# Patient Record
Sex: Male | Born: 1970 | Race: White | Hispanic: No | Marital: Married | Smoking: Never smoker
Health system: Southern US, Community
[De-identification: ages and names within clinical notes are randomized; demographics above are authoritative.]

## PROBLEM LIST (undated history)

## (undated) DIAGNOSIS — K8020 Calculus of gallbladder without cholecystitis without obstruction: Secondary | ICD-10-CM

## (undated) HISTORY — PX: BACK SURGERY: SHX140

---

## 2007-03-06 ENCOUNTER — Ambulatory Visit (HOSPITAL_COMMUNITY): Admission: RE | Admit: 2007-03-06 | Discharge: 2007-03-07 | Payer: Self-pay | Admitting: Neurosurgery

## 2011-01-16 NOTE — Op Note (Signed)
NAME:  [name], Paul             ACCOUNT NO.:  [id number]   MEDICAL RECORD NO.:  [id number]          PATIENT TYPE:  OIB   LOCATION:  3172                         FACILITY:  [facility]   PHYSICIAN:  Henry A. Pool, M.D.    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   PROCEDURE NOTE:  Left L4-5 laminotomy and microdiskectomy.   SURGEON:  Pool.   ASSISTANT:  Kritzer.   ANESTHESIA:  General oroendotracheal.   PREMEDICATION:  [name] is a 40 year old male with history of severe  back and left lower extremity pain consistent with left-sided L5  radiculopathy.  Workup demonstrates evidence of very large left  paracentral disk herniation with severe compression upon the thecal sac  and left L5 nerve root.  The patient has been counseled as to his  options.  He decided to proceed with an urgent left-sided L4-5  laminotomy microdiskectomy.   OPERATIVE NOTE:  The patient taken to the operating room and placed on  the operating table in supine position.  After an adequate level of  anesthesia achieved, the patient was positioned prone onto Wilson frame,  appropriately padded.  The patient's lumbar region was prepped and  draped sterilely.  A 10 blade was used to make a linear incision  overlying the L4-5 interspace.  This was carried down sharply in the  midline.  A subperiosteal dissection was performed exposing the lamina  and facet joint of L4-L5 on the left side.  Deep self-retaining  retractor was placed.  Intraoperative x-rays taken and level was  confirmed.  Laminotomy was then performed using a high-speed drill and  Kerrison rongeurs to remove the inferior aspect of the lamina of L4,  medial aspect of L4-5 facet joint  and the superior rim of L5-1.  Ligament flavum was then elevated and resected in piecemeal fashion.  Underlying thecal sac and left L5 nerve root were identified.  Microscope was brought into the field __________  microdissection left  side of L5 nerve root underlying disk herniation.  Epidural venous  plexus was coagulated and cut.  Thecal sac and L5 nerve were gently  mobilized and directed towards the midline.  Disk herniation was readily  apparent.  This was incised with a 15 blade in rectangular fashion.  A  wide disk space clean-out was then achieved using pituitary rongeurs,  upbiting pituitary rongeurs and Epstein curettes.  All elements of the  disk herniation were completely resected.  All obviously degenerative  disk material was removed from the interspace.  After very thorough  diskectomy was performed, the canal was inspected, and there was no  evidence of any residual compression on the thecal sac or nerve roots.  There was no evidence of injury to the thecal sac or nerve roots.  Wound  was then irrigated with antibiotic solution.  Gelfoam was placed  topically for hemostasis which was found  be good.  Microscope and  retractor system were removed.  Hemostasis of the muscles was achieved  with electrocautery.  Wounds were closed in layers using Vicryl sutures.  Steri-Strips and  sterile dressing were applied.  There were no complications.  The  patient tolerated the procedure well, and he returned to the recovery  room for postoperative care.           ______________________________  Kathaleen Maser. Pool, M.D.     HAP/MEDQ  D:  03/06/2007  T:  03/07/2007  Job:  [id number]

## 2011-06-19 LAB — ABO/RH: ABO/RH(D): A POS

## 2011-06-19 LAB — CBC
Hemoglobin: 16.2
RBC: 5.43
WBC: 9.9

## 2011-06-19 LAB — DIFFERENTIAL
Lymphocytes Relative: 34
Lymphs Abs: 3.4 — ABNORMAL HIGH
Monocytes Absolute: 1 — ABNORMAL HIGH
Monocytes Relative: 10
Neutro Abs: 5.2
Neutrophils Relative %: 52

## 2011-06-19 LAB — TYPE AND SCREEN: Antibody Screen: NEGATIVE

## 2015-09-04 DIAGNOSIS — K8020 Calculus of gallbladder without cholecystitis without obstruction: Secondary | ICD-10-CM

## 2015-09-04 HISTORY — DX: Calculus of gallbladder without cholecystitis without obstruction: K80.20

## 2017-01-22 ENCOUNTER — Emergency Department (HOSPITAL_COMMUNITY): Payer: 59

## 2017-01-22 ENCOUNTER — Emergency Department (HOSPITAL_COMMUNITY)
Admission: EM | Admit: 2017-01-22 | Discharge: 2017-01-22 | Disposition: A | Discharge status: Home / Self Care | Payer: 59 | Attending: Emergency Medicine | Admitting: Emergency Medicine

## 2017-01-22 ENCOUNTER — Encounter (HOSPITAL_COMMUNITY): Payer: Self-pay | Admitting: *Deleted

## 2017-01-22 DIAGNOSIS — K8050 Calculus of bile duct without cholangitis or cholecystitis without obstruction: Secondary | ICD-10-CM | POA: Diagnosis not present

## 2017-01-22 DIAGNOSIS — Z79899 Other long term (current) drug therapy: Secondary | ICD-10-CM | POA: Diagnosis not present

## 2017-01-22 DIAGNOSIS — R1013 Epigastric pain: Secondary | ICD-10-CM | POA: Diagnosis present

## 2017-01-22 DIAGNOSIS — R1010 Upper abdominal pain, unspecified: Secondary | ICD-10-CM

## 2017-01-22 LAB — COMPREHENSIVE METABOLIC PANEL
ALK PHOS: 63 U/L (ref 38–126)
ALT: 63 U/L (ref 17–63)
AST: 39 U/L (ref 15–41)
Albumin: 4.2 g/dL (ref 3.5–5.0)
Anion gap: 8 (ref 5–15)
BUN: 18 mg/dL (ref 6–20)
CALCIUM: 9.1 mg/dL (ref 8.9–10.3)
CO2: 27 mmol/L (ref 22–32)
CREATININE: 1.13 mg/dL (ref 0.61–1.24)
Chloride: 103 mmol/L (ref 101–111)
Glucose, Bld: 120 mg/dL — ABNORMAL HIGH (ref 65–99)
Potassium: 3.7 mmol/L (ref 3.5–5.1)
Sodium: 138 mmol/L (ref 135–145)
Total Bilirubin: 0.3 mg/dL (ref 0.3–1.2)
Total Protein: 7.7 g/dL (ref 6.5–8.1)

## 2017-01-22 LAB — CBC WITH DIFFERENTIAL/PLATELET
Basophils Absolute: 0 10*3/uL (ref 0.0–0.1)
Basophils Relative: 0 %
Eosinophils Absolute: 0.2 10*3/uL (ref 0.0–0.7)
Eosinophils Relative: 2 %
HCT: 47.1 % (ref 39.0–52.0)
HEMOGLOBIN: 16.4 g/dL (ref 13.0–17.0)
LYMPHS ABS: 3.5 10*3/uL (ref 0.7–4.0)
LYMPHS PCT: 39 %
MCH: 30.8 pg (ref 26.0–34.0)
MCHC: 34.8 g/dL (ref 30.0–36.0)
MCV: 88.5 fL (ref 78.0–100.0)
Monocytes Absolute: 0.8 10*3/uL (ref 0.1–1.0)
Monocytes Relative: 8 %
NEUTROS ABS: 4.6 10*3/uL (ref 1.7–7.7)
NEUTROS PCT: 51 %
Platelets: 195 10*3/uL (ref 150–400)
RBC: 5.32 MIL/uL (ref 4.22–5.81)
RDW: 12.8 % (ref 11.5–15.5)
WBC: 9.1 10*3/uL (ref 4.0–10.5)

## 2017-01-22 LAB — LIPASE, BLOOD: LIPASE: 35 U/L (ref 11–51)

## 2017-01-22 MED ORDER — HYDROCODONE-ACETAMINOPHEN 5-325 MG PO TABS: ORAL_TABLET | ORAL | 0 refills | Status: DC

## 2017-01-22 MED ORDER — IOPAMIDOL (ISOVUE-300) INJECTION 61%
100.0000 mL | Freq: Once | INTRAVENOUS | Status: AC | PRN
  Administered 2017-01-22: 100 mL via INTRAVENOUS

## 2017-01-22 MED ORDER — HYDROMORPHONE HCL 1 MG/ML IJ SOLN
1.0000 mg | Freq: Once | INTRAMUSCULAR | Status: AC
  Administered 2017-01-22: 1 mg via INTRAVENOUS
  Filled 2017-01-22: qty 1

## 2017-01-22 MED ORDER — IOPAMIDOL (ISOVUE-300) INJECTION 61%
INTRAVENOUS | Status: AC
Start: 2017-01-22 — End: 2017-01-22
  Administered 2017-01-22: 30 mL
  Filled 2017-01-22: qty 30

## 2017-01-22 MED ORDER — ONDANSETRON 4 MG PO TBDP: 4.0000 mg | ORAL_TABLET | Freq: Three times a day (TID) | ORAL | 0 refills | Status: AC | PRN

## 2017-01-22 MED ORDER — ONDANSETRON HCL 4 MG/2ML IJ SOLN
4.0000 mg | Freq: Once | INTRAMUSCULAR | Status: AC
  Administered 2017-01-22: 4 mg via INTRAVENOUS
  Filled 2017-01-22: qty 2

## 2017-01-22 NOTE — ED Triage Notes (Signed)
Pt c/o epigastric abdominal pain that started last night around 2230; pt states he has vomited x 1

## 2017-01-22 NOTE — ED Notes (Signed)
PT tolerating po fluids with no nausea at this time.

## 2017-01-22 NOTE — ED Provider Notes (Signed)
[facility] DEPT Provider Note   CSN: [id number] Arrival date & time: 01/22/17  0251     History   Chief Complaint Chief Complaint  Patient presents with  . Abdominal Pain    HPI Paul [name] is a 46 y.o. male.  HPI Patient presents to the emergency department with complaints of acute onset epigastric upper abdominal pain which began last night at approximately 11 PM.  He's never had pain like this before.  He vomited once at home and is now vomiting here in the ER.  He's never had such severe pain in his upper abdomen.  Denies lower abdominal pain.  Denies flank pain.  Reports no dysuria or urinary frequency.  His pain is severe in severity.  Denies chest pain shortness of breath.  No recent cough or illness.  Denies fevers and chills   History reviewed. No pertinent past medical history.  There are no active problems to display for this patient.   Past Surgical History:  Procedure Laterality Date  . BACK SURGERY         Home Medications    Prior to Admission medications   Not on File    Family History History reviewed. No pertinent family history.  Social History Social History  Substance Use Topics  . Smoking status: Never Smoker  . Smokeless tobacco: Never Used  . Alcohol use Yes     Comment: rarely     Allergies   Patient has no known allergies.   Review of Systems Review of Systems  All other systems reviewed and are negative.    Physical Exam Updated Vital Signs BP (!) 152/96 (BP Location: Left Arm)   Pulse (!) 57   Temp 97.6 F (36.4 C) (Oral)   Resp (!) 21   Ht 5\' 8"  (1.727 m)   Wt 122.5 kg (270 lb)   SpO2 98%   BMI 41.05 kg/m   Physical Exam  Constitutional: He is oriented to person, place, and time. He appears well-developed and well-nourished.  Uncomfortable appearing  HENT:  Head: Normocephalic and atraumatic.  Eyes: EOM are normal.  Neck: Normal range of motion.  Cardiovascular: Normal rate, regular rhythm and  normal heart sounds.   Pulmonary/Chest: Effort normal and breath sounds normal. No respiratory distress.  Abdominal: Soft. He exhibits no distension.  Mild epigastric tenderness  Musculoskeletal: Normal range of motion.  Neurological: He is alert and oriented to person, place, and time.  Skin: Skin is warm and dry.  Psychiatric: He has a normal mood and affect. Judgment normal.  Nursing note and vitals reviewed.    ED Treatments / Results  Labs (all labs ordered are listed, but only abnormal results are displayed) Labs Reviewed  COMPREHENSIVE METABOLIC PANEL - Abnormal; Notable for the following:       Result Value   Glucose, Bld 120 (*)    All other components within normal limits  CBC WITH DIFFERENTIAL/PLATELET  LIPASE, BLOOD    EKG  EKG Interpretation None       Radiology Ct Abdomen Pelvis W Contrast  Result Date: 01/22/2017 CLINICAL DATA:  Acute onset severe upper abdominal and epigastric pain. Vomiting. EXAM: CT ABDOMEN AND PELVIS WITH CONTRAST TECHNIQUE: Multidetector CT imaging of the abdomen and pelvis was performed using the standard protocol following bolus administration of intravenous contrast. CONTRAST:  30mL ISOVUE-300 IOPAMIDOL (ISOVUE-300) INJECTION 61%, ISOVUE-300 IOPAMIDOL (ISOVUE-300) INJECTION 61% COMPARISON:  None. FINDINGS: Lower chest: Atelectasis in the lung bases. Hepatobiliary: Diffuse fatty infiltration of the  liver. No focal lesions. Gallbladder and bile ducts are unremarkable. Pancreas: Unremarkable. No pancreatic ductal dilatation or surrounding inflammatory changes. Spleen: Normal in size without focal abnormality. Adrenals/Urinary Tract: Adrenal glands are unremarkable. Kidneys are normal, without renal calculi, focal lesion, or hydronephrosis. Bladder is unremarkable. Stomach/Bowel: Stomach, small bowel, and colon are not abnormally distended. No wall thickening is appreciated. Scattered stool throughout the colon. Appendix is normal.  Vascular/Lymphatic: No significant vascular findings are present. No enlarged abdominal or pelvic lymph nodes. Reproductive: Prostate gland is enlarged, measuring 4 x 4.3 cm diameter. Other: No abdominal wall hernia or abnormality. No abdominopelvic ascites. Musculoskeletal: Degenerative changes in the spine. No destructive bone lesions. IMPRESSION: Diffuse fatty infiltration of the liver. No evidence of bowel obstruction or inflammation. Prostate enlargement. Electronically Signed   By: Burman NievesWilliam  Stevens M.D.   On: 01/22/2017 04:38    Procedures Procedures (including critical care time)  Medications Ordered in ED Medications  HYDROmorphone (DILAUDID) injection 1 mg (1 mg Intravenous Given 01/22/17 0340)  ondansetron (ZOFRAN) injection 4 mg (4 mg Intravenous Given 01/22/17 0338)  iopamidol (ISOVUE-300) 61 % injection (30 mLs  Contrast Given 01/22/17 0413)  iopamidol (ISOVUE-300) 61 % injection 100 mL (100 mLs Intravenous Contrast Given 01/22/17 0413)  HYDROmorphone (DILAUDID) injection 1 mg (1 mg Intravenous Given 01/22/17 0433)     Initial Impression / Assessment and Plan / ED Course  I have reviewed the triage vital signs and the nursing notes.  Pertinent labs & imaging results that were available during my care of the patient were reviewed by me and considered in my medical decision making (see chart for details).     6:21 AM Patient still with some discomfort.  Patient will undergo ultrasound to further evaluate.  Suspect this is likely biliary colic.  It would be helpful to confirm gallstones on ultrasound.  If his ultrasound shows no signs of gallstones he may benefit from outpatient GI follow-up and endoscopy.  Symptomatic control now  7:10 AM Pain nearly gone now. US pending. Care transferred to Dr Clarene DukeMcManus to follow up on US  Final Clinical Impressions(s) / ED Diagnoses   Final diagnoses:  Upper abdominal pain    New Prescriptions New Prescriptions   No medications on file      Azalia Bilisampos, Latravia Southgate, MD 01/22/17 [phone]

## 2017-01-22 NOTE — Discharge Instructions (Signed)
Eat a bland diet, avoiding greasy, fatty, fried foods, as well as spicy and acidic foods or beverages.  Avoid eating within the hour or 2 before going to bed or laying down.  Also avoid teas, colas, coffee, chocolate, pepermint and spearment.  Take the prescriptions as directed.  Call the General Surgeon today to schedule a follow up appointment within the next week.  Return to the Emergency Department immediately if worsening.

## 2017-01-22 NOTE — ED Provider Notes (Signed)
Pt received at sign out with Paul [name] RUQ pending. Paul [name] without acute cholecystitis. LFT's and WBC count normal. Pt has tol PO well without N/V. States he wants to go home now. Dx and testing d/w pt and family.  Questions answered.  Verb understanding, agreeable to d/c home with outpt f/u.    Results for orders placed or performed during the hospital encounter of 01/22/17  CBC with Differential/Platelet  Result Value Ref Range   WBC 9.1 4.0 - 10.5 K/uL   RBC 5.32 4.22 - 5.81 MIL/uL   Hemoglobin 16.4 13.0 - 17.0 g/dL   HCT 16.1 09.6 - 04.5 %   MCV 88.5 78.0 - 100.0 fL   MCH 30.8 26.0 - 34.0 pg   MCHC 34.8 30.0 - 36.0 g/dL   RDW 40.9 81.1 - 91.4 %   Platelets 195 150 - 400 K/uL   Neutrophils Relative % 51 %   Neutro Abs 4.6 1.7 - 7.7 K/uL   Lymphocytes Relative 39 %   Lymphs Abs 3.5 0.7 - 4.0 K/uL   Monocytes Relative 8 %   Monocytes Absolute 0.8 0.1 - 1.0 K/uL   Eosinophils Relative 2 %   Eosinophils Absolute 0.2 0.0 - 0.7 K/uL   Basophils Relative 0 %   Basophils Absolute 0.0 0.0 - 0.1 K/uL  Comprehensive metabolic panel  Result Value Ref Range   Sodium 138 135 - 145 mmol/L   Potassium 3.7 3.5 - 5.1 mmol/L   Chloride 103 101 - 111 mmol/L   CO2 27 22 - 32 mmol/L   Glucose, Bld 120 (H) 65 - 99 mg/dL   BUN 18 6 - 20 mg/dL   Creatinine, Ser 7.82 0.61 - 1.24 mg/dL   Calcium 9.1 8.9 - 95.6 mg/dL   Total Protein 7.7 6.5 - 8.1 g/dL   Albumin 4.2 3.5 - 5.0 g/dL   AST 39 15 - 41 U/L   ALT 63 17 - 63 U/L   Alkaline Phosphatase 63 38 - 126 U/L   Total Bilirubin 0.3 0.3 - 1.2 mg/dL   GFR calc non Af Amer >60 >60 mL/min   GFR calc Af Amer >60 >60 mL/min   Anion gap 8 5 - 15  Lipase, blood  Result Value Ref Range   Lipase 35 11 - 51 U/L   Ct Abdomen Pelvis W Contrast Result Date: 01/22/2017 CLINICAL DATA:  Acute onset severe upper abdominal and epigastric pain. Vomiting. EXAM: CT ABDOMEN AND PELVIS WITH CONTRAST TECHNIQUE: Multidetector CT imaging of the abdomen and pelvis was performed  using the standard protocol following bolus administration of intravenous contrast. CONTRAST:  30mL ISOVUE-300 IOPAMIDOL (ISOVUE-300) INJECTION 61%, ISOVUE-300 IOPAMIDOL (ISOVUE-300) INJECTION 61% COMPARISON:  None. FINDINGS: Lower chest: Atelectasis in the lung bases. Hepatobiliary: Diffuse fatty infiltration of the liver. No focal lesions. Gallbladder and bile ducts are unremarkable. Pancreas: Unremarkable. No pancreatic ductal dilatation or surrounding inflammatory changes. Spleen: Normal in size without focal abnormality. Adrenals/Urinary Tract: Adrenal glands are unremarkable. Kidneys are normal, without renal calculi, focal lesion, or hydronephrosis. Bladder is unremarkable. Stomach/Bowel: Stomach, small bowel, and colon are not abnormally distended. No wall thickening is appreciated. Scattered stool throughout the colon. Appendix is normal. Vascular/Lymphatic: No significant vascular findings are present. No enlarged abdominal or pelvic lymph nodes. Reproductive: Prostate gland is enlarged, measuring 4 x 4.3 cm diameter. Other: No abdominal wall hernia or abnormality. No abdominopelvic ascites. Musculoskeletal: Degenerative changes in the spine. No destructive bone lesions. IMPRESSION: Diffuse fatty infiltration of the liver. No evidence of  bowel obstruction or inflammation. Prostate enlargement. Electronically Signed   By: Burman NievesWilliam  Stevens M.D.   On: 01/22/2017 04:38   [place] Abdomen Limited Ruq Result Date: 01/22/2017 CLINICAL DATA:  Right upper quadrant pain for 12 hours. EXAM: US ABDOMEN LIMITED - RIGHT UPPER QUADRANT COMPARISON:  CT abdomen and pelvis this same day. FINDINGS: Gallbladder: There is some sludge and a few small stones within the gallbladder measuring up to 0.9 cm. No gallbladder wall thickening or pericholecystic fluid. Common bile duct: Diameter: 0.4 cm. Liver: Increased echogenicity and coarsened echotexture are identified. No focal lesion. IMPRESSION: Small gallstones and  gallbladder sludge without evidence of cholecystitis. Fatty infiltration of the liver. Electronically Signed   By: Drusilla Kannerhomas  Dalessio M.D.   On: 01/22/2017 08:29      Paul [name], Paul Vandervoort, DO 01/22/17 1004

## 2017-01-24 ENCOUNTER — Encounter: Payer: Self-pay | Admitting: General Surgery

## 2017-01-24 ENCOUNTER — Ambulatory Visit (INDEPENDENT_AMBULATORY_CARE_PROVIDER_SITE_OTHER): Payer: 59 | Admitting: General Surgery

## 2017-01-24 VITALS — BP 141/91 | HR 70 | Temp 97.8°F | Resp 18 | Ht 68.0 in | Wt 269.0 lb

## 2017-01-24 DIAGNOSIS — K8020 Calculus of gallbladder without cholecystitis without obstruction: Secondary | ICD-10-CM

## 2017-01-24 NOTE — Progress Notes (Signed)
Paul [name]; [id number]; 1971/04/23   HPI  patient is a 46 year old white male who was referred by the emergency room for evaluation and treatment of cholelithiasis.  He had presented to the emergency room with an episode of right upper quadrant abdominal pain and nausea.  His liver enzyme tests were within normal limits.  Ultrasound gallbladder revealed cholelithiasis with normal common bile duct.  He had normal white blood cell count.  The pain subsequently resolved after 3-4 hours.  He has not had any further pain.  He states he has never had right upper quadrant abdominal pain with radiation to the right flank, nausea, fatty food intolerance, jaundice in the past.  He has been doing well since his visit to the emergency room.  He has 0/10 pain at the present time. No past medical history on file.  Past Surgical History:  Procedure Laterality Date  . BACK SURGERY      No family history on file.  Current Outpatient Prescriptions on File Prior to Visit  Medication Sig Dispense Refill  . fluticasone (FLONASE) 50 MCG/ACT nasal spray Place 1 spray into both nostrils daily.    Marland Kitchen. HYDROcodone-acetaminophen (NORCO/VICODIN) 5-325 MG tablet 1 or 2 tabs PO q6 hours prn pain 15 tablet 0  . levocetirizine (XYZAL) 5 MG tablet Take 5 mg by mouth every evening.    . naproxen sodium (ANAPROX) 220 MG tablet Take 220 mg by mouth 2 (two) times daily with a meal. For back pain    . ondansetron (ZOFRAN ODT) 4 MG disintegrating tablet Take 1 tablet (4 mg total) by mouth every 8 (eight) hours as needed for nausea or vomiting. 6 tablet 0   No current facility-administered medications on file prior to visit.     No Known Allergies  History  Alcohol Use  . Yes    Comment: rarely    History  Smoking Status  . Never Smoker  Smokeless Tobacco  . Never Used    Review of Systems  Constitutional: Positive for malaise/fatigue.  HENT: Negative.   Eyes: Negative.   Respiratory: Negative.    Cardiovascular: Negative.   Gastrointestinal: Negative.   Genitourinary: Negative.   Musculoskeletal: Negative.   Skin: Negative.   Neurological: Negative.   Endo/Heme/Allergies: Negative.   Psychiatric/Behavioral: Negative.     Objective   Vitals:   01/24/17 0933  BP: (!) 141/91  Pulse: 70  Resp: 18  Temp: 97.8 F (36.6 C)    Physical Exam  Constitutional: He is oriented to person, place, and time and well-developed, well-nourished, and in no distress.  HENT:  Head: Normocephalic and atraumatic.  Right Ear: External ear normal.  Eyes: No scleral icterus.  Neck: Normal range of motion. Neck supple.  Cardiovascular: Normal rate, regular rhythm and normal heart sounds.   No murmur heard. Pulmonary/Chest: Effort normal and breath sounds normal. He has no wheezes. He has no rales.  Abdominal: Soft. Bowel sounds are normal. He exhibits no distension. There is no tenderness. There is no rebound.  Neurological: He is alert and oriented to person, place, and time.  Skin: Skin is warm and dry.  Vitals reviewed.  ER notes reviewed Assessment  Cholelithiasis, currently asymptomatic Plan     Patient does not need a laparoscopic cholecystectomy at this time.  He was told that he has a good chance over the next 2-3 years of having a recurrent episode.  Patient would like to have the surgery later in the summer, which is fine with me.  We will given about biliary colic and gallstones.  He will call to schedule surgery.  Risks and benefits of the procedure including bleeding, infection, renal injury, and the possibility of an open procedure were fully explained to the patient, who gave informed consent.

## 2017-01-24 NOTE — Patient Instructions (Signed)
Laparoscopic Cholecystectomy Laparoscopic cholecystectomy is surgery to remove the gallbladder. The gallbladder is a pear-shaped organ that lies beneath the liver on the right side of the body. The gallbladder stores bile, which is a fluid that helps the body to digest fats. Cholecystectomy is often done for inflammation of the gallbladder (cholecystitis). This condition is usually caused by a buildup of gallstones (cholelithiasis) in the gallbladder. Gallstones can block the flow of bile, which can result in inflammation and pain. In severe cases, emergency surgery may be required. This procedure is done though small incisions in your abdomen (laparoscopic surgery). A thin scope with a camera (laparoscope) is inserted through one incision. Thin surgical instruments are inserted through the other incisions. In some cases, a laparoscopic procedure may be turned into a type of surgery that is done through a larger incision (open surgery). Tell a health care provider about:  Any allergies you have.  All medicines you are taking, including vitamins, herbs, eye drops, creams, and over-the-counter medicines.  Any problems you or family members have had with anesthetic medicines.  Any blood disorders you have.  Any surgeries you have had.  Any medical conditions you have.  Whether you are pregnant or may be pregnant. What are the risks? Generally, this is a safe procedure. However, problems may occur, including:  Infection.  Bleeding.  Allergic reactions to medicines.  Damage to other structures or organs.  A stone remaining in the common bile duct. The common bile duct carries bile from the gallbladder into the small intestine.  A bile leak from the cyst duct that is clipped when your gallbladder is removed. What happens before the procedure? Staying hydrated  Follow instructions from your health care provider about hydration, which may include:  Up to 2 hours before the procedure - you  may continue to drink clear liquids, such as water, clear fruit juice, black coffee, and plain tea. Eating and drinking restrictions  Follow instructions from your health care provider about eating and drinking, which may include:  8 hours before the procedure - stop eating heavy meals or foods such as meat, fried foods, or fatty foods.  6 hours before the procedure - stop eating light meals or foods, such as toast or cereal.  6 hours before the procedure - stop drinking milk or drinks that contain milk.  2 hours before the procedure - stop drinking clear liquids. Medicines   Ask your health care provider about:  Changing or stopping your regular medicines. This is especially important if you are taking diabetes medicines or blood thinners.  Taking medicines such as aspirin and ibuprofen. These medicines can thin your blood. Do not take these medicines before your procedure if your health care provider instructs you not to.  You may be given antibiotic medicine to help prevent infection. General instructions   Let your health care provider know if you develop a cold or an infection before surgery.  Plan to have someone take you home from the hospital or clinic.  Ask your health care provider how your surgical site will be marked or identified. What happens during the procedure?  To reduce your risk of infection:  Your health care team will wash or sanitize their hands.  Your skin will be washed with soap.  Hair may be removed from the surgical area.  An IV tube may be inserted into one of your veins.  You will be given one or more of the following:  A medicine to help you relax (  sedative).  A medicine to make you fall asleep (general anesthetic).  A breathing tube will be placed in your mouth.  Your surgeon will make several small cuts (incisions) in your abdomen.  The laparoscope will be inserted through one of the small incisions. The camera on the laparoscope will  send images to a TV screen (monitor) in the operating room. This lets your surgeon see inside your abdomen.  Air-like gas will be pumped into your abdomen. This will expand your abdomen to give the surgeon more room to perform the surgery.  Other tools that are needed for the procedure will be inserted through the other incisions. The gallbladder will be removed through one of the incisions.  Your common bile duct may be examined. If stones are found in the common bile duct, they may be removed.  After your gallbladder has been removed, the incisions will be closed with stitches (sutures), staples, or skin glue.  Your incisions may be covered with a bandage (dressing). The procedure may vary among health care providers and hospitals. What happens after the procedure?  Your blood pressure, heart rate, breathing rate, and blood oxygen level will be monitored until the medicines you were given have worn off.  You will be given medicines as needed to control your pain.  Do not drive for 24 hours if you were given a sedative. This information is not intended to replace advice given to you by your health care provider. Make sure you discuss any questions you have with your health care provider. Document Released: 08/20/2005 Document Revised: 03/11/2016 Document Reviewed: 02/06/2016 Elsevier Interactive Patient Education  2017 [employer] Cholelithiasis Cholelithiasis is a form of gallbladder disease in which gallstones form in the gallbladder. The gallbladder is an organ that stores bile. Bile is made in the liver, and it helps to digest fats. Gallstones begin as small crystals and slowly grow into stones. They may cause no symptoms until the gallbladder tightens (contracts) and a gallstone is blocking the duct (gallbladder attack), which can cause pain. Cholelithiasis is also referred to as gallstones. There are two main types of gallstones:  Cholesterol stones. These are made of hardened  cholesterol and are usually yellow-green in color. They are the most common type of gallstone. Cholesterol is a white, waxy, fat-like substance that is made in the liver.  Pigment stones. These are dark in color and are made of a red-yellow substance that forms when hemoglobin from red blood cells breaks down (bilirubin). What are the causes? This condition may be caused by an imbalance in the substances that bile is made of. This can happen if the bile:  Has too much bilirubin.  Has too much cholesterol.  Does not have enough bile salts. These salts help the body absorb and digest fats. In some cases, this condition can also be caused by the gallbladder not emptying completely or often enough. What increases the risk? The following factors may make you more likely to develop this condition:  Being male.  Having multiple pregnancies. Health care providers sometimes advise removing diseased gallbladders before future pregnancies.  Eating a diet that is heavy in fried foods, fat, and refined carbohydrates, like white bread and white rice.  Being obese.  Being older than age 40.  Prolonged use of medicines that contain male hormones (estrogen).  Having diabetes mellitus.  Rapidly losing weight.  Having a family history of gallstones.  Being of American Indian or [place] descent.  Having an intestinal disease such as Crohn   disease.  Having metabolic syndrome.  Having cirrhosis.  Having severe types of anemia such as sickle cell anemia. What are the signs or symptoms? In most cases, there are no symptoms. These are known as silent gallstones. If a gallstone blocks the bile ducts, it can cause a gallbladder attack. The main symptom of a gallbladder attack is sudden pain in the upper right abdomen. The pain usually comes at night or after eating a large meal. The pain can last for one or several hours and can spread to the right shoulder or chest. If the bile duct is blocked  for more than a few hours, it can cause infection or inflammation of the gallbladder, liver, or pancreas, which may cause:  Nausea.  Vomiting.  Abdominal pain that lasts for 5 hours or more.  Fever or chills.  Yellowing of the skin or the whites of the eyes (jaundice).  Dark urine.  Light-colored stools. How is this diagnosed? This condition may be diagnosed based on:  A physical exam.  Your medical history.  An ultrasound of your gallbladder.  CT scan.  MRI.  Blood tests to check for signs of infection or inflammation.  A scan of your gallbladder and bile ducts (biliary system) using nonharmful radioactive material and special cameras that can see the radioactive material (cholescintigram). This test checks to see how your gallbladder contracts and whether bile ducts are blocked.  Inserting a small tube with a camera on the end (endoscope) through your mouth to inspect bile ducts and check for blockages (endoscopic retrograde cholangiopancreatogram). How is this treated? Treatment for gallstones depends on the severity of the condition. Silent gallstones do not need treatment. If the gallstones cause a gallbladder attack or other symptoms, treatment may be required. Options for treatment include:  Surgery to remove the gallbladder (cholecystectomy). This is the most common treatment.  Medicines to dissolve gallstones. These are most effective at treating small gallstones. You may need to take medicines for up to 6-12 months.  Shock wave treatment (extracorporeal biliary lithotripsy). In this treatment, an ultrasound machine sends shock waves to the gallbladder to break gallstones into smaller pieces. These pieces can then be passed into the intestines or be dissolved by medicine. This is rarely used.  Removing gallstones through endoscopic retrograde cholangiopancreatogram. A small basket can be attached to the endoscope and used to capture and remove gallstones. Follow  these instructions at home:  Take over-the-counter and prescription medicines only as told by your health care provider.  Maintain a healthy weight and follow a healthy diet. This includes:  Reducing fatty foods, such as fried food.  Reducing refined carbohydrates, like white bread and white rice.  Increasing fiber. Aim for foods like almonds, fruit, and beans.  Keep all follow-up visits as told by your health care provider. This is important. Contact a health care provider if:  You think you have had a gallbladder attack.  You have been diagnosed with silent gallstones and you develop abdominal pain or indigestion. Get help right away if:  You have pain from a gallbladder attack that lasts for more than 2 hours.  You have abdominal pain that lasts for more than 5 hours.  You have a fever or chills.  You have persistent nausea and vomiting.  You develop jaundice.  You have dark urine or light-colored stools. Summary  Cholelithiasis (also called gallstones) is a form of gallbladder disease in which gallstones form in the gallbladder.  This condition is caused by an imbalance in   the substances that make up bile. This can happen if the bile has too much cholesterol, too much bilirubin, or not enough bile salts.  You are more likely to develop this condition if you are male, pregnant, using medicines with estrogen, obese, older than age 40, or have a family history of gallstones. You may also develop gallstones if you have diabetes, an intestinal disease, cirrhosis, or metabolic syndrome.  Treatment for gallstones depends on the severity of the condition. Silent gallstones do not need treatment.  If gallstones cause a gallbladder attack or other symptoms, treatment may be needed. The most common treatment is surgery to remove the gallbladder. This information is not intended to replace advice given to you by your health care provider. Make sure you discuss any questions you  have with your health care provider. Document Released: 08/16/2005 Document Revised: 05/06/2016 Document Reviewed: 05/06/2016 Elsevier Interactive Patient Education  2017 [employer]  

## 2017-01-29 NOTE — H&P (Signed)
Paul [name]; [id number]; 1970-11-14   HPI  patient is a 46 year old white male who was referred by the emergency room for evaluation and treatment of cholelithiasis.  He had presented to the emergency room with an episode of right upper quadrant abdominal pain and nausea.  His liver enzyme tests were within normal limits.  Ultrasound gallbladder revealed cholelithiasis with normal common bile duct.  He had normal white blood cell count.  The pain subsequently resolved after 3-4 hours.  He has not had any further pain.  He states he has never had right upper quadrant abdominal pain with radiation to the right flank, nausea, fatty food intolerance, jaundice in the past.  He has been doing well since his visit to the emergency room.  He has 0/10 pain at the present time. No past medical history on file.       Past Surgical History:  Procedure Laterality Date  . BACK SURGERY      No family history on file.        Current Outpatient Prescriptions on File Prior to Visit  Medication Sig Dispense Refill  . fluticasone (FLONASE) 50 MCG/ACT nasal spray Place 1 spray into both nostrils daily.    Marland Kitchen HYDROcodone-acetaminophen (NORCO/VICODIN) 5-325 MG tablet 1 or 2 tabs PO q6 hours prn pain 15 tablet 0  . levocetirizine (XYZAL) 5 MG tablet Take 5 mg by mouth every evening.    . naproxen sodium (ANAPROX) 220 MG tablet Take 220 mg by mouth 2 (two) times daily with a meal. For back pain    . ondansetron (ZOFRAN ODT) 4 MG disintegrating tablet Take 1 tablet (4 mg total) by mouth every 8 (eight) hours as needed for nausea or vomiting. 6 tablet 0   No current facility-administered medications on file prior to visit.     No Known Allergies      History  Alcohol Use  . Yes    Comment: rarely       History  Smoking Status  . Never Smoker  Smokeless Tobacco  . Never Used    Review of Systems  Constitutional: Positive for malaise/fatigue.  HENT: Negative.   Eyes:  Negative.   Respiratory: Negative.   Cardiovascular: Negative.   Gastrointestinal: Negative.   Genitourinary: Negative.   Musculoskeletal: Negative.   Skin: Negative.   Neurological: Negative.   Endo/Heme/Allergies: Negative.   Psychiatric/Behavioral: Negative.     Objective      Vitals:   01/24/17 0933  BP: (!) 141/91  Pulse: 70  Resp: 18  Temp: 97.8 F (36.6 C)    Physical Exam  Constitutional: He is oriented to person, place, and time and well-developed, well-nourished, and in no distress.  HENT:  Head: Normocephalic and atraumatic.  Right Ear: External ear normal.  Eyes: No scleral icterus.  Neck: Normal range of motion. Neck supple.  Cardiovascular: Normal rate, regular rhythm and normal heart sounds.   No murmur heard. Pulmonary/Chest: Effort normal and breath sounds normal. He has no wheezes. He has no rales.  Abdominal: Soft. Bowel sounds are normal. He exhibits no distension. There is no tenderness. There is no rebound.  Neurological: He is alert and oriented to person, place, and time.  Skin: Skin is warm and dry.  Vitals reviewed.  ER notes reviewed Assessment  Cholelithiasis, currently asymptomatic Plan     Patient does not need a laparoscopic cholecystectomy at this time.  He was told that he has a good chance over the next 2-3 years of having  a recurrent episode.  Patient would like to have the surgery later in the summer, which is fine with me.  We will given about biliary colic and gallstones.  He will call to schedule surgery.  Risks and benefits of the procedure including bleeding, infection, hepatobiliary injury, and the possibility of an open procedure were fully explained to the patient, who gave informed consent.

## 2017-02-04 ENCOUNTER — Inpatient Hospital Stay (HOSPITAL_COMMUNITY): Admission: RE | Admit: 2017-02-04 | Payer: 59 | Source: Ambulatory Visit

## 2017-02-08 ENCOUNTER — Ambulatory Visit (HOSPITAL_COMMUNITY): Admission: RE | Admit: 2017-02-08 | Payer: 59 | Source: Ambulatory Visit | Admitting: General Surgery

## 2017-02-08 ENCOUNTER — Encounter (HOSPITAL_COMMUNITY): Admission: RE | Payer: Self-pay | Source: Ambulatory Visit

## 2017-02-08 SURGERY — LAPAROSCOPIC CHOLECYSTECTOMY
Anesthesia: General

## 2018-07-31 ENCOUNTER — Encounter (HOSPITAL_COMMUNITY): Payer: Self-pay | Admitting: Emergency Medicine

## 2018-07-31 ENCOUNTER — Other Ambulatory Visit: Payer: Self-pay

## 2018-07-31 ENCOUNTER — Emergency Department (HOSPITAL_COMMUNITY)
Admission: EM | Admit: 2018-07-31 | Discharge: 2018-07-31 | Disposition: A | Discharge status: Home / Self Care | Payer: BLUE CROSS/BLUE SHIELD | Attending: Emergency Medicine | Admitting: Emergency Medicine

## 2018-07-31 DIAGNOSIS — Z79899 Other long term (current) drug therapy: Secondary | ICD-10-CM | POA: Insufficient documentation

## 2018-07-31 DIAGNOSIS — R112 Nausea with vomiting, unspecified: Secondary | ICD-10-CM | POA: Diagnosis not present

## 2018-07-31 DIAGNOSIS — R1011 Right upper quadrant pain: Secondary | ICD-10-CM | POA: Diagnosis present

## 2018-07-31 HISTORY — DX: Calculus of gallbladder without cholecystitis without obstruction: K80.20

## 2018-07-31 LAB — CBC WITH DIFFERENTIAL/PLATELET
Abs Immature Granulocytes: 0.04 10*3/uL (ref 0.00–0.07)
Basophils Absolute: 0.1 10*3/uL (ref 0.0–0.1)
Basophils Relative: 1 %
Eosinophils Absolute: 0.2 10*3/uL (ref 0.0–0.5)
Eosinophils Relative: 2 %
HCT: 48.5 % (ref 39.0–52.0)
Hemoglobin: 16 g/dL (ref 13.0–17.0)
Immature Granulocytes: 1 %
Lymphocytes Relative: 35 %
Lymphs Abs: 2.9 10*3/uL (ref 0.7–4.0)
MCH: 29.5 pg (ref 26.0–34.0)
MCHC: 33 g/dL (ref 30.0–36.0)
MCV: 89.5 fL (ref 80.0–100.0)
Monocytes Absolute: 0.7 10*3/uL (ref 0.1–1.0)
Monocytes Relative: 9 %
Neutro Abs: 4.3 10*3/uL (ref 1.7–7.7)
Neutrophils Relative %: 52 %
Platelets: 221 10*3/uL (ref 150–400)
RBC: 5.42 MIL/uL (ref 4.22–5.81)
RDW: 12.6 % (ref 11.5–15.5)
WBC: 8.1 10*3/uL (ref 4.0–10.5)
nRBC: 0 % (ref 0.0–0.2)

## 2018-07-31 LAB — COMPREHENSIVE METABOLIC PANEL
ALK PHOS: 61 U/L (ref 38–126)
ALT: 72 U/L — AB (ref 0–44)
AST: 46 U/L — ABNORMAL HIGH (ref 15–41)
Albumin: 4 g/dL (ref 3.5–5.0)
Anion gap: 7 (ref 5–15)
BUN: 17 mg/dL (ref 6–20)
CO2: 23 mmol/L (ref 22–32)
Calcium: 8.7 mg/dL — ABNORMAL LOW (ref 8.9–10.3)
Chloride: 106 mmol/L (ref 98–111)
Creatinine, Ser: 1.07 mg/dL (ref 0.61–1.24)
GFR calc Af Amer: >60 mL/min (ref >60)
GFR calc non Af Amer: >60 mL/min (ref >60)
Glucose, Bld: 127 mg/dL — ABNORMAL HIGH (ref 70–99)
Potassium: 4.1 mmol/L (ref 3.5–5.1)
Sodium: 136 mmol/L (ref 135–145)
Total Bilirubin: 0.6 mg/dL (ref 0.3–1.2)
Total Protein: 7.3 g/dL (ref 6.5–8.1)

## 2018-07-31 LAB — LIPASE, BLOOD: Lipase: 34 U/L (ref 11–51)

## 2018-07-31 MED ORDER — ONDANSETRON HCL 4 MG PO TABS: 4.0000 mg | ORAL_TABLET | Freq: Three times a day (TID) | ORAL | 0 refills | Status: AC | PRN

## 2018-07-31 MED ORDER — SODIUM CHLORIDE 0.9 % IV BOLUS
1000.0000 mL | Freq: Once | INTRAVENOUS | Status: AC
  Administered 2018-07-31: 1000 mL via INTRAVENOUS

## 2018-07-31 MED ORDER — HYDROCODONE-ACETAMINOPHEN 5-325 MG PO TABS
1.0000 | ORAL_TABLET | Freq: Once | ORAL | Status: AC
  Administered 2018-07-31: 1 via ORAL
  Filled 2018-07-31: qty 1

## 2018-07-31 MED ORDER — MORPHINE SULFATE (PF) 4 MG/ML IV SOLN
4.0000 mg | Freq: Once | INTRAVENOUS | Status: AC
  Administered 2018-07-31: 4 mg via INTRAVENOUS
  Filled 2018-07-31: qty 1

## 2018-07-31 MED ORDER — FENTANYL CITRATE (PF) 100 MCG/2ML IJ SOLN
50.0000 ug | Freq: Once | INTRAMUSCULAR | Status: AC
  Administered 2018-07-31: 50 ug via INTRAVENOUS
  Filled 2018-07-31: qty 2

## 2018-07-31 MED ORDER — HYDROCODONE-ACETAMINOPHEN 5-325 MG PO TABS: 1.0000 | ORAL_TABLET | Freq: Four times a day (QID) | ORAL | 0 refills | Status: AC | PRN

## 2018-07-31 MED ORDER — ONDANSETRON HCL 4 MG/2ML IJ SOLN
4.0000 mg | Freq: Once | INTRAMUSCULAR | Status: AC
  Administered 2018-07-31: 4 mg via INTRAVENOUS
  Filled 2018-07-31: qty 2

## 2018-07-31 NOTE — Discharge Instructions (Signed)
Avoid fried, spicy, greasy or high fat foods.  Drink plenty of fluids.  Take the pain medicine if needed and nausea medication if needed.  Return to the emergency department if you get worsening pain, fever, or uncontrolled vomiting.  Please call Dr. Lovell SheehanJenkins office, the surgeon on-call, to discuss if you need to have your gallbladder removed.

## 2018-07-31 NOTE — ED Triage Notes (Signed)
Pt c/o mid upper abd pain with n/v since 0130 this am, pt has hx of gall stones 2 yrs ago, pt reports feels same

## 2018-07-31 NOTE — ED Notes (Signed)
Pt aware of need for urine specimen. 

## 2018-07-31 NOTE — ED Provider Notes (Signed)
[facility] EMERGENCY DEPARTMENT Provider Note   CSN: [id number] Arrival date & time: 07/31/18  [id number]  Time seen 6:00 AM   History   Chief Complaint Chief Complaint  Patient presents with  . Abdominal Pain    HPI Paul [name] is a 47 y.o. male.  HPI patient states he made spicy chicken 2 nights ago and had a spicy chicken sandwich at lunch yesterday.  For dinner he ate a taco salad.  He states that 1 AM he was awakened from sleep with intense sharp epigastric pain that does not radiate into his back.  He has had nausea and vomiting twice.  He denies fever or diarrhea.  He states he had something similar 2 years ago and then about a year ago.  He did have test and was told he had gallstones but he did not follow through since it was not bothering him much.  He states his older sister has had to have her gallbladder removed.  PCP Marguerita MerlesLong, Ashley, PA-C   Past Medical History:  Diagnosis Date  . Gall stones 2017    There are no active problems to display for this patient.   Past Surgical History:  Procedure Laterality Date  . BACK SURGERY          Home Medications    Prior to Admission medications   Medication Sig Start Date End Date Taking? Authorizing Provider  HYDROcodone-acetaminophen (NORCO/VICODIN) 5-325 MG tablet Take 1 tablet by mouth every 6 (six) hours as needed. 07/31/18   Devoria AlbeKnapp, Amorie Rentz, MD  levocetirizine (XYZAL) 5 MG tablet Take 5 mg by mouth at bedtime as needed for allergies.     [provider]  Multiple Vitamin (MULTIVITAMIN WITH MINERALS) TABS tablet Take 1 tablet by mouth daily.    [provider]  naproxen sodium (ANAPROX) 220 MG tablet Take 220 mg by mouth 2 (two) times daily as needed. For back pain     [provider]  ondansetron (ZOFRAN ODT) 4 MG disintegrating tablet Take 1 tablet (4 mg total) by mouth every 8 (eight) hours as needed for nausea or vomiting. 01/22/17   [name], Kathleen, DO  ondansetron (ZOFRAN) 4 MG  tablet Take 1 tablet (4 mg total) by mouth every 8 (eight) hours as needed. 07/31/18   Devoria AlbeKnapp, Silveria Botz, MD  Probiotic Product (PROBIOTIC PO) Take 1 capsule by mouth at bedtime.    [provider]    Family History History reviewed. No pertinent family history.  Social History Social History   Tobacco Use  . Smoking status: Never Smoker  . Smokeless tobacco: Never Used  Substance Use Topics  . Alcohol use: Yes    Comment: rarely  . Drug use: No  employed   Allergies   Patient has no known allergies.   Review of Systems Review of Systems  All other systems reviewed and are negative.    Physical Exam Updated Vital Signs BP 134/82 (BP Location: Left Arm)   Pulse 69   Temp 97.6 F (36.4 C) (Oral)   Resp 19   Ht 5\' 8"  (1.727 m)   Wt 113.4 kg   SpO2 99%   BMI 38.01 kg/m   Vital signs normal    Physical Exam  Constitutional: He is oriented to person, place, and time. He appears well-developed and well-nourished.  Non-toxic appearance. He does not appear ill.  Appears uncomfortable  HENT:  Head: Normocephalic and atraumatic.  Right Ear: External ear normal.  Left Ear: External ear normal.  Nose: Nose normal. No mucosal edema or rhinorrhea.  Mouth/Throat: Oropharynx is clear and moist and mucous membranes are normal. No dental abscesses or uvula swelling.  Eyes: Pupils are equal, round, and reactive to light. Conjunctivae and EOM are normal.  Neck: Normal range of motion and full passive range of motion without pain. Neck supple.  Cardiovascular: Normal rate, regular rhythm and normal heart sounds. Exam reveals no gallop and no friction rub.  No murmur heard. Pulmonary/Chest: Effort normal and breath sounds normal. No respiratory distress. He has no wheezes. He has no rhonchi. He has no rales. He exhibits no tenderness and no crepitus.  Abdominal: Soft. Normal appearance and bowel sounds are normal. He exhibits no distension. There is tenderness in the right  upper quadrant and epigastric area. There is no rebound and no guarding.  No CVA tenderness  Musculoskeletal: Normal range of motion. He exhibits no edema or tenderness.  Moves all extremities well.   Neurological: He is alert and oriented to person, place, and time. He has normal strength. No cranial nerve deficit.  Skin: Skin is warm, dry and intact. No rash noted. No erythema. No pallor.  Psychiatric: He has a normal mood and affect. His speech is normal and behavior is normal. His mood appears not anxious.  Nursing note and vitals reviewed.    ED Treatments / Results  Labs (all labs ordered are listed, but only abnormal results are displayed) Results for orders placed or performed during the hospital encounter of 07/31/18  Comprehensive metabolic panel  Result Value Ref Range   Sodium 136 135 - 145 mmol/L   Potassium 4.1 3.5 - 5.1 mmol/L   Chloride 106 98 - 111 mmol/L   CO2 23 22 - 32 mmol/L   Glucose, Bld 127 (H) 70 - 99 mg/dL   BUN 17 6 - 20 mg/dL   Creatinine, Ser 1.61 0.61 - 1.24 mg/dL   Calcium 8.7 (L) 8.9 - 10.3 mg/dL   Total Protein 7.3 6.5 - 8.1 g/dL   Albumin 4.0 3.5 - 5.0 g/dL   AST 46 (H) 15 - 41 U/L   ALT 72 (H) 0 - 44 U/L   Alkaline Phosphatase 61 38 - 126 U/L   Total Bilirubin 0.6 0.3 - 1.2 mg/dL   GFR calc non Af Amer >60 >60 mL/min   GFR calc Af Amer >60 >60 mL/min   Anion gap 7 5 - 15  Lipase, blood  Result Value Ref Range   Lipase 34 11 - 51 U/L  CBC with Differential  Result Value Ref Range   WBC 8.1 4.0 - 10.5 K/uL   RBC 5.42 4.22 - 5.81 MIL/uL   Hemoglobin 16.0 13.0 - 17.0 g/dL   HCT 09.6 04.5 - 40.9 %   MCV 89.5 80.0 - 100.0 fL   MCH 29.5 26.0 - 34.0 pg   MCHC 33.0 30.0 - 36.0 g/dL   RDW 81.1 91.4 - 78.2 %   Platelets 221 150 - 400 K/uL   nRBC 0.0 0.0 - 0.2 %   Neutrophils Relative % 52 %   Neutro Abs 4.3 1.7 - 7.7 K/uL   Lymphocytes Relative 35 %   Lymphs Abs 2.9 0.7 - 4.0 K/uL   Monocytes Relative 9 %   Monocytes Absolute 0.7 0.1 - 1.0  K/uL   Eosinophils Relative 2 %   Eosinophils Absolute 0.2 0.0 - 0.5 K/uL   Basophils Relative 1 %   Basophils Absolute 0.1 0.0 - 0.1 K/uL   Immature  Granulocytes 1 %   Abs Immature Granulocytes 0.04 0.00 - 0.07 K/uL   Laboratory interpretation all normal except mild elevation of his SGOT/PT    EKG None  Radiology No results found.    Jan 22, 2017 EXAM: US ABDOMEN LIMITED - RIGHT UPPER QUADRANT  COMPARISON:  CT abdomen and pelvis this same day.  IMPRESSION: Small gallstones and gallbladder sludge without evidence of cholecystitis.  Fatty infiltration of the liver.   Electronically Signed   By: Drusilla Kanner M.D.   On: 01/22/2017 08:29   Jan 22, 2017 EXAM: CT ABDOMEN AND PELVIS WITH CONTRAST  COMPARISON:  None.  FINDINGS:  Hepatobiliary: Diffuse fatty infiltration of the liver. No focal lesions. Gallbladder and bile ducts are unremarkable.  Pancreas: Unremarkable. No pancreatic ductal dilatation or surrounding inflammatory changes.  IMPRESSION: Diffuse fatty infiltration of the liver. No evidence of bowel obstruction or inflammation. Prostate enlargement.   Electronically Signed   By: Burman Nieves M.D.   On: 01/22/2017 04:38  Procedures Procedures (including critical care time)  Medications Ordered in ED  Medications  sodium chloride 0.9 % bolus 1,000 mL (1,000 mLs Intravenous New Bag/Given 07/31/18 0601)  fentaNYL (SUBLIMAZE) injection 50 mcg (50 mcg Intravenous Given 07/31/18 0616)  ondansetron (ZOFRAN) injection 4 mg (4 mg Intravenous Given 07/31/18 0616)  morphine 4 MG/ML injection 4 mg (4 mg Intravenous Given 07/31/18 0732)     Initial Impression / Assessment and Plan / ED Course  I have reviewed the triage vital signs and the nursing notes.  Pertinent labs & imaging results that were available during my care of the patient were reviewed by me and considered in my medical decision making (see chart for details).      Patient was given IV fluids, IV pain and nausea medication.  Laboratory testing was ordered.  Recheck at 7:15 AM he states his pain is improved but still present.  He seems much more comfortable.  He is no longer clutching his abdomen.  We discussed his test results.  Ultrasound is not available today due to the holiday.  He was given morphine for pain.  We discussed watching what he eats and following up with his surgeon as an outpatient.  Recheck at 8 AM patient states his pain is almost gone.  He was given oral hydrocodone.  He was given a work note for today.  Final Clinical Impressions(s) / ED Diagnoses   Final diagnoses:  RUQ pain    ED Discharge Orders         Ordered    HYDROcodone-acetaminophen (NORCO/VICODIN) 5-325 MG tablet  Every 6 hours PRN     07/31/18 0745    ondansetron (ZOFRAN) 4 MG tablet  Every 8 hours PRN     07/31/18 0745          Plan discharge  Devoria Albe, MD, Concha Pyo, MD 07/31/18 [phone]

## 2019-01-13 IMAGING — CT CT ABD-PELV W/ CM
2 of 4 series · 16 of 46 positions shown, 18 images · IV contrast (Isovue)
Comparison: None.

CLINICAL DATA: Acute onset severe upper abdominal and epigastric
pain. Vomiting.

EXAM:
CT ABDOMEN AND PELVIS WITH CONTRAST
TECHNIQUE: Multidetector CT imaging of the abdomen and pelvis was performed
using the standard protocol following bolus administration of
intravenous contrast.
CONTRAST:  30mL JZ0HEY-WGG IOPAMIDOL (JZ0HEY-WGG) INJECTION 61%,
100mL [id number] IOPAMIDOL (JZ0HEY-WGG) INJECTION 61%

[Series 2: axial st · axial · 0.97mm/px · z∈[-729,-239]mm · 13 of 108 slices shown, 15 images]
[im 5/108  soft-tissue]
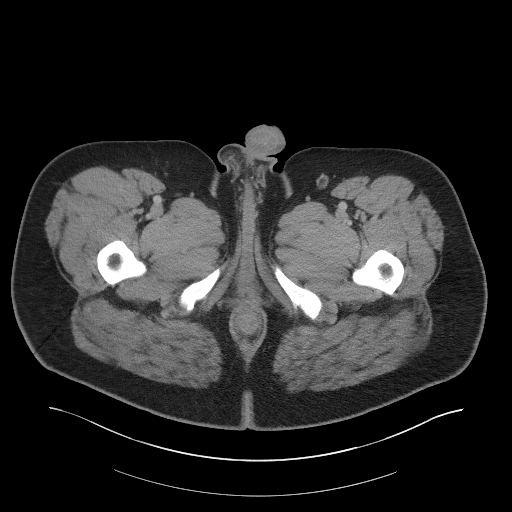
[im 5/108  bone]
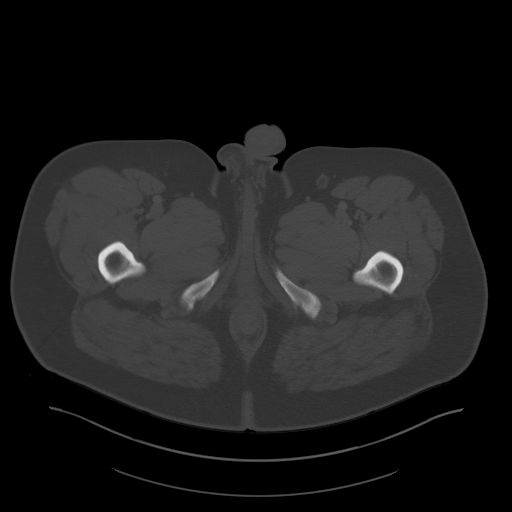
[im 15/108  soft-tissue]
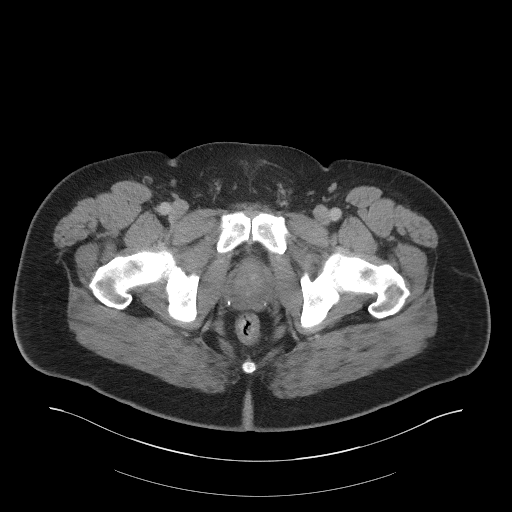
[im 25/108  soft-tissue]
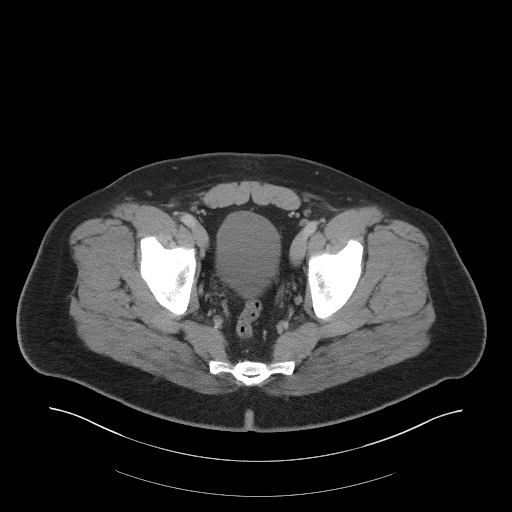
[im 30/108  soft-tissue]
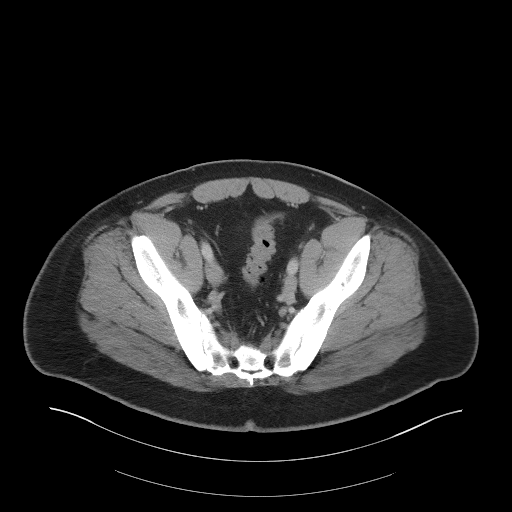
[im 39/108  soft-tissue]
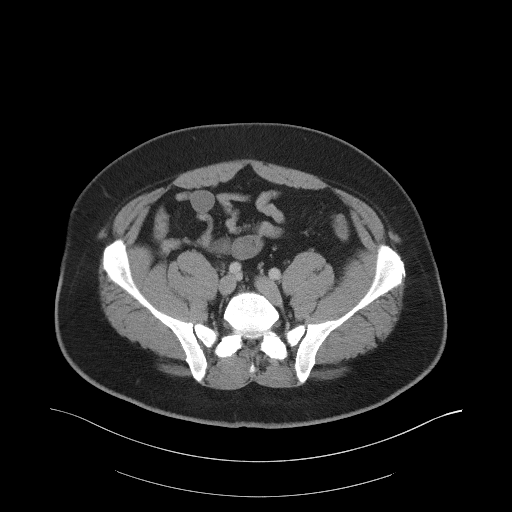
[im 44/108  soft-tissue]
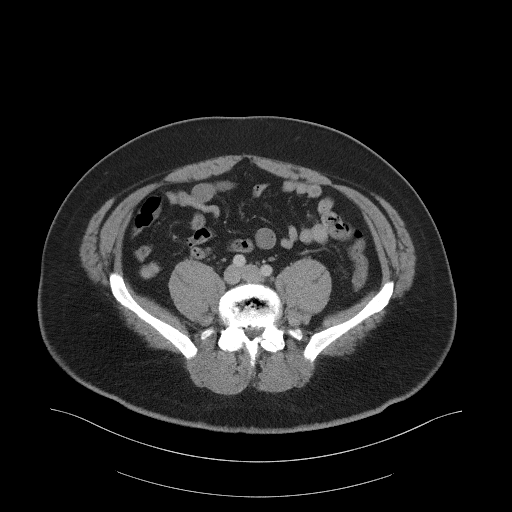
[im 54/108  soft-tissue]
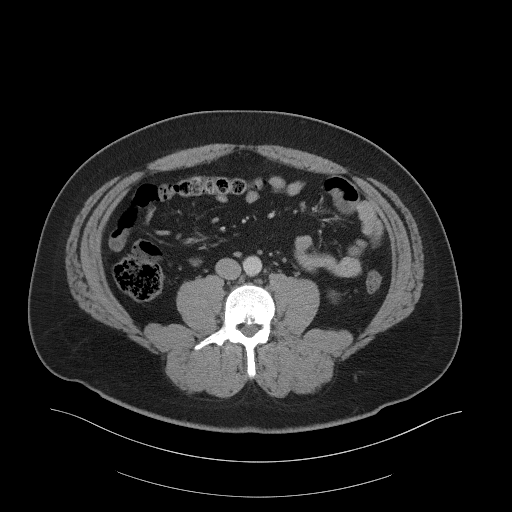
[im 64/108  soft-tissue]
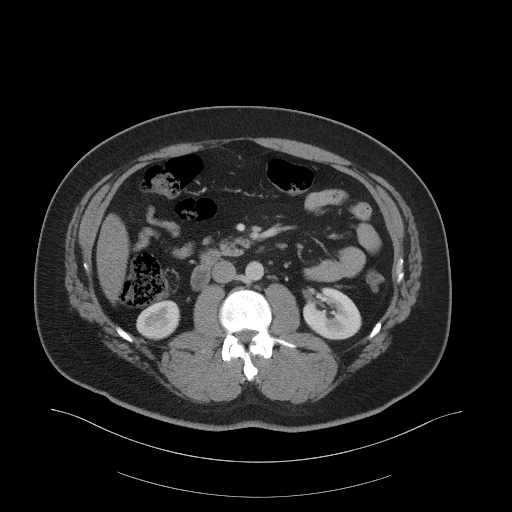
[im 69/108  soft-tissue]
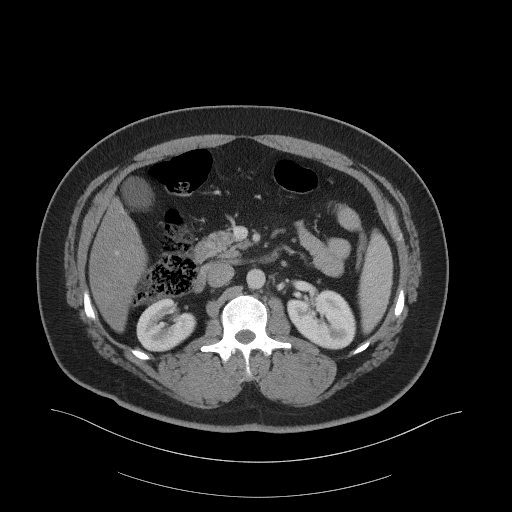
[im 69/108  bone]
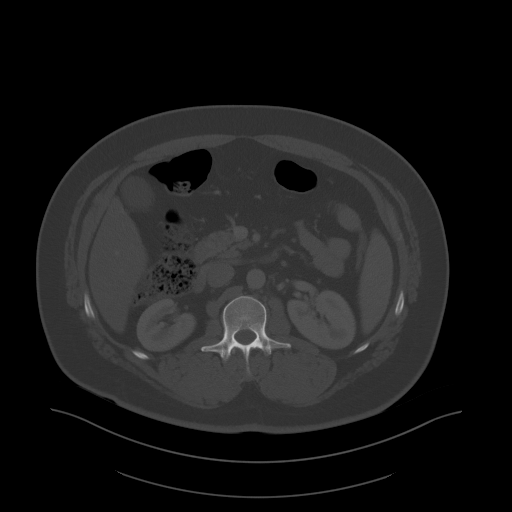
[im 78/108  soft-tissue]
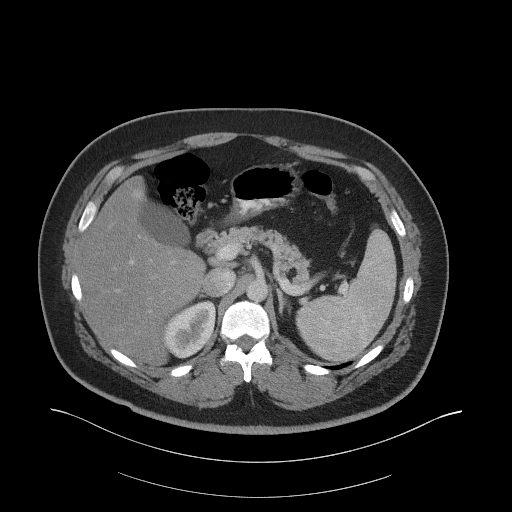
[im 83/108  soft-tissue]
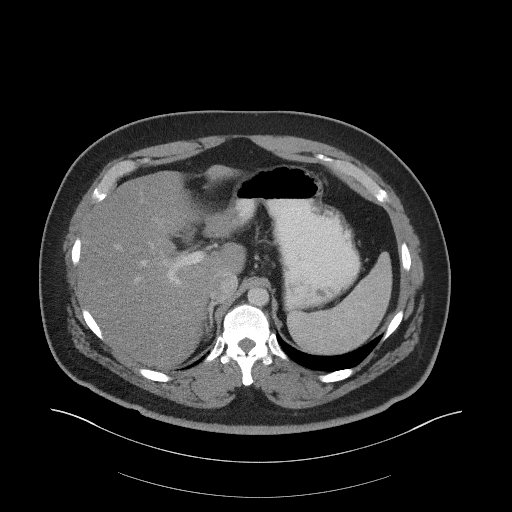
[im 93/108  soft-tissue]
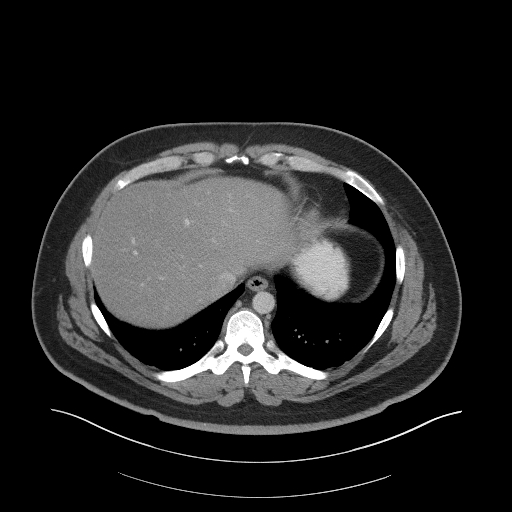
[im 103/108  soft-tissue]
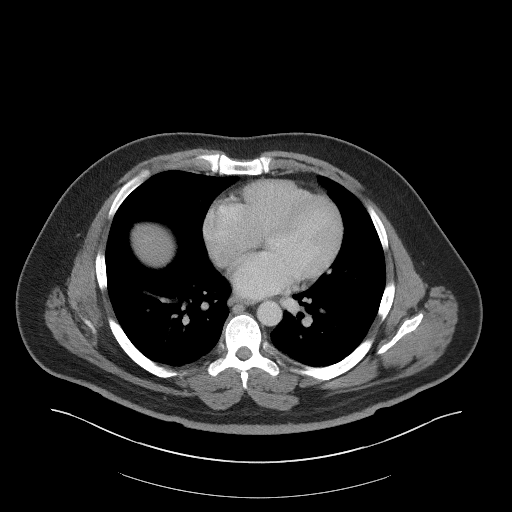

[Series 5: coronal st · coronal · 0.94mm/px · 3 of 110 slices shown]
[im 37/110  soft-tissue]
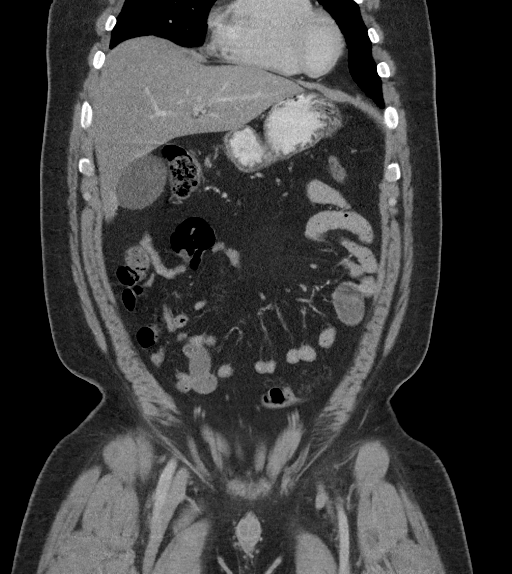
[im 49/110  soft-tissue]
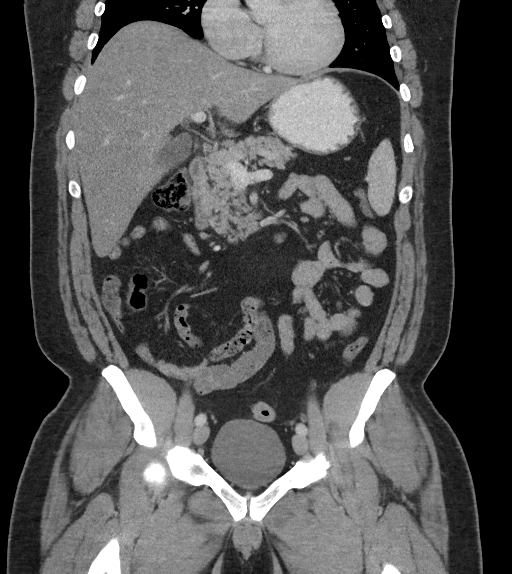
[im 61/110  soft-tissue]
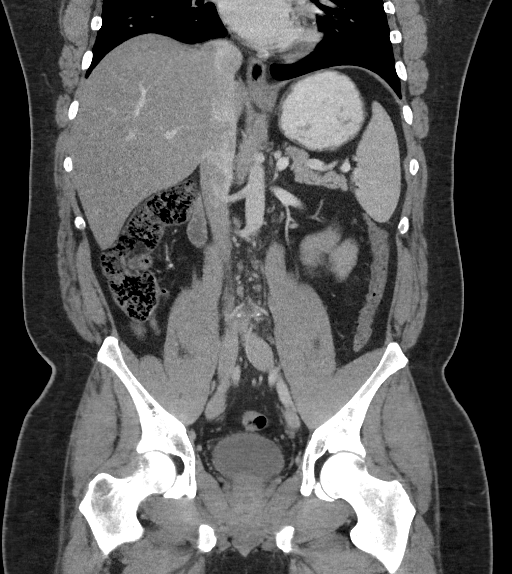

[16 of 46 positions shown; findings below may reference images not displayed]

FINDINGS: Lower chest: Atelectasis in the lung bases.

Hepatobiliary: Diffuse fatty infiltration of the liver. No focal
lesions. Gallbladder and bile ducts are unremarkable.

Pancreas: Unremarkable. No pancreatic ductal dilatation or
surrounding inflammatory changes.

Spleen: Normal in size without focal abnormality.

Adrenals/Urinary Tract: Adrenal glands are unremarkable. Kidneys are
normal, without renal calculi, focal lesion, or hydronephrosis.
Bladder is unremarkable.

Stomach/Bowel: Stomach, small bowel, and colon are not abnormally
distended. No wall thickening is appreciated. Scattered stool
throughout the colon. Appendix is normal.

Vascular/Lymphatic: No significant vascular findings are present. No
enlarged abdominal or pelvic lymph nodes.

Reproductive: Prostate gland is enlarged, measuring 4 x 4.3 cm
diameter.

Other: No abdominal wall hernia or abnormality. No abdominopelvic
ascites.

Musculoskeletal: Degenerative changes in the spine. No destructive
bone lesions.
IMPRESSION: Diffuse fatty infiltration of the liver. No evidence of bowel
obstruction or inflammation. Prostate enlargement.
